# Patient Record
Sex: Male | Born: 1935 | Race: White | Hispanic: No | Marital: Married
Health system: Southern US, Community
[De-identification: ages and names within clinical notes are randomized; demographics above are authoritative.]

---

## 2007-08-05 ENCOUNTER — Encounter: Payer: Self-pay | Admitting: Internal Medicine

## 2007-08-06 ENCOUNTER — Encounter: Payer: Self-pay | Admitting: Internal Medicine

## 2007-09-06 ENCOUNTER — Encounter: Payer: Self-pay | Admitting: Internal Medicine

## 2009-06-29 ENCOUNTER — Emergency Department: Payer: Self-pay | Admitting: Emergency Medicine

## 2009-06-29 ENCOUNTER — Inpatient Hospital Stay: Payer: Self-pay | Admitting: Internal Medicine

## 2011-12-14 ENCOUNTER — Ambulatory Visit: Payer: Self-pay | Admitting: Family Medicine

## 2012-01-24 ENCOUNTER — Ambulatory Visit: Payer: Self-pay | Admitting: Specialist

## 2012-04-11 ENCOUNTER — Ambulatory Visit: Payer: Self-pay | Admitting: Specialist

## 2012-04-15 ENCOUNTER — Ambulatory Visit: Payer: Self-pay | Admitting: Cardiothoracic Surgery

## 2012-04-24 ENCOUNTER — Ambulatory Visit: Payer: Self-pay | Admitting: Cardiothoracic Surgery

## 2012-05-05 ENCOUNTER — Ambulatory Visit: Payer: Self-pay | Admitting: Cardiothoracic Surgery

## 2013-03-07 LAB — COMPREHENSIVE METABOLIC PANEL
Alkaline Phosphatase: 79 U/L (ref 50–136)
BUN: 21 mg/dL — ABNORMAL HIGH (ref 7–18)
Chloride: 101 mmol/L (ref 98–107)
Co2: 31 mmol/L (ref 21–32)
Creatinine: 0.96 mg/dL (ref 0.60–1.30)
Glucose: 142 mg/dL — ABNORMAL HIGH (ref 65–99)
Osmolality: 277 (ref 275–301)
Potassium: 4.2 mmol/L (ref 3.5–5.1)
SGOT(AST): 20 U/L (ref 15–37)
SGPT (ALT): 20 U/L (ref 12–78)
Sodium: 136 mmol/L (ref 136–145)

## 2013-03-07 LAB — CBC
HGB: 10.1 g/dL — ABNORMAL LOW (ref 13.0–18.0)
MCH: 25 pg — ABNORMAL LOW (ref 26.0–34.0)
MCV: 76 fL — ABNORMAL LOW (ref 80–100)
Platelet: 233 10*3/uL (ref 150–440)
RBC: 4.03 10*6/uL — ABNORMAL LOW (ref 4.40–5.90)
RDW: 17.9 % — ABNORMAL HIGH (ref 11.5–14.5)
WBC: 9.1 10*3/uL (ref 3.8–10.6)

## 2013-03-07 LAB — URINALYSIS, COMPLETE
Blood: NEGATIVE
Glucose,UR: NEGATIVE mg/dL (ref 0–75)
Ketone: NEGATIVE
Specific Gravity: 1.01 (ref 1.003–1.030)
Squamous Epithelial: NONE SEEN

## 2013-03-08 ENCOUNTER — Ambulatory Visit: Payer: Self-pay | Admitting: Neurology

## 2013-03-08 ENCOUNTER — Inpatient Hospital Stay: Payer: Self-pay | Admitting: Internal Medicine

## 2013-03-08 LAB — CBC WITH DIFFERENTIAL/PLATELET
Eosinophil #: 0.2 10*3/uL (ref 0.0–0.7)
Eosinophil %: 1.9 %
HGB: 9.4 g/dL — ABNORMAL LOW (ref 13.0–18.0)
Lymphocyte #: 1.6 10*3/uL (ref 1.0–3.6)
Lymphocyte %: 16.2 %
MCHC: 32.7 g/dL (ref 32.0–36.0)
MCV: 76 fL — ABNORMAL LOW (ref 80–100)
Monocyte %: 7.3 %
Neutrophil #: 7.5 10*3/uL — ABNORMAL HIGH (ref 1.4–6.5)
Neutrophil %: 73.6 %
RBC: 3.83 10*6/uL — ABNORMAL LOW (ref 4.40–5.90)
WBC: 10.2 10*3/uL (ref 3.8–10.6)

## 2013-03-08 LAB — COMPREHENSIVE METABOLIC PANEL
Albumin: 2.5 g/dL — ABNORMAL LOW (ref 3.4–5.0)
Alkaline Phosphatase: 73 U/L (ref 50–136)
BUN: 21 mg/dL — ABNORMAL HIGH (ref 7–18)
Bilirubin,Total: 0.3 mg/dL (ref 0.2–1.0)
Calcium, Total: 8.8 mg/dL (ref 8.5–10.1)
Chloride: 103 mmol/L (ref 98–107)
EGFR (African American): >60
EGFR (Non-African Amer.): >60
Glucose: 60 mg/dL — ABNORMAL LOW (ref 65–99)
Potassium: 4.5 mmol/L (ref 3.5–5.1)
SGOT(AST): 19 U/L (ref 15–37)
Sodium: 139 mmol/L (ref 136–145)
Total Protein: 8.1 g/dL (ref 6.4–8.2)

## 2013-03-08 LAB — HEMOGLOBIN A1C: Hemoglobin A1C: 7.1 % — ABNORMAL HIGH (ref 4.2–6.3)

## 2013-03-08 LAB — CK TOTAL AND CKMB (NOT AT ARMC)
CK, Total: 92 U/L (ref 35–232)
CK, Total: 117 U/L (ref 35–232)
CK-MB: 3.6 ng/mL (ref 0.5–3.6)

## 2013-03-08 LAB — TROPONIN I: Troponin-I: <0.02 ng/mL

## 2013-03-09 LAB — URINE CULTURE

## 2013-03-13 LAB — CULTURE, BLOOD (SINGLE)

## 2013-04-05 DEATH — deceased

## 2013-12-05 IMAGING — CT CT HEAD WITHOUT CONTRAST
3 series · 17 of 30 positions shown, 19 images · non-contrast
Comparison: none

REASON FOR EXAM: seizure-like activity, altered mental status
COMMENTS:   May transport without cardiac monitor

PROCEDURE:     CT  - CT HEAD WITHOUT CONTRAST  - March 07, 2013 [DATE]
RESULT:     History: Seizure.
Comparison Study: Head CT of 06/29/2009.

[Series 2: soft tissue · axial · 0.80mm/px · z∈[-184,-54]mm · 8 of 34 slices shown (1 of 2)]
[im 4/34  brain]
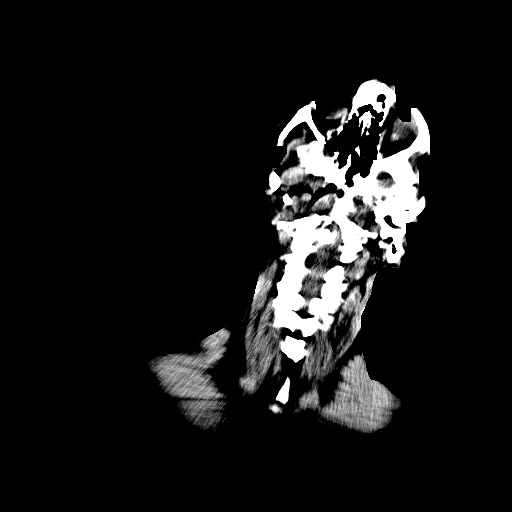
[im 8/34  brain]
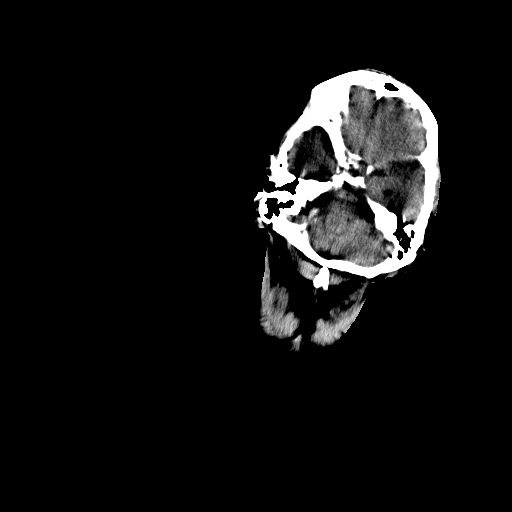
[im 12/34  brain]
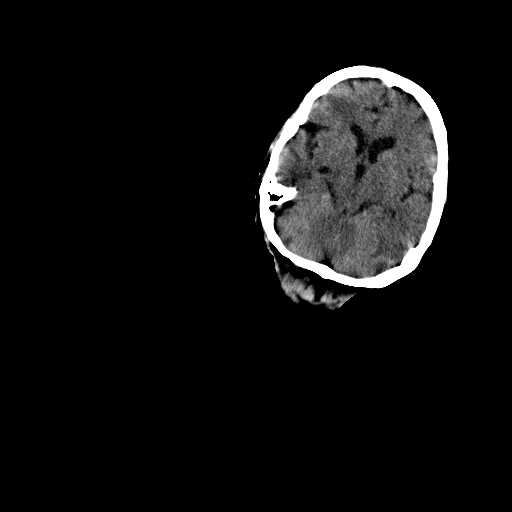
[im 15/34  brain]
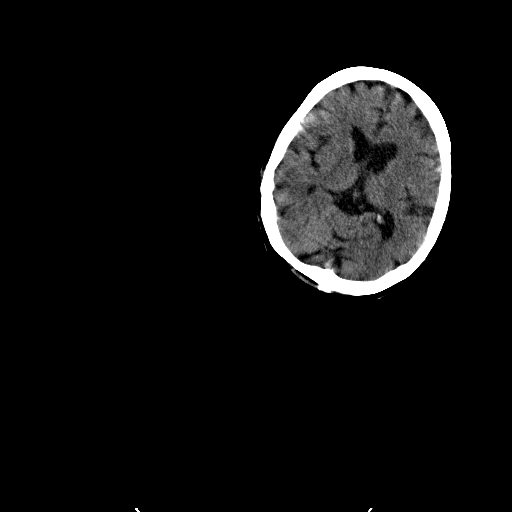
[im 19/34  brain]
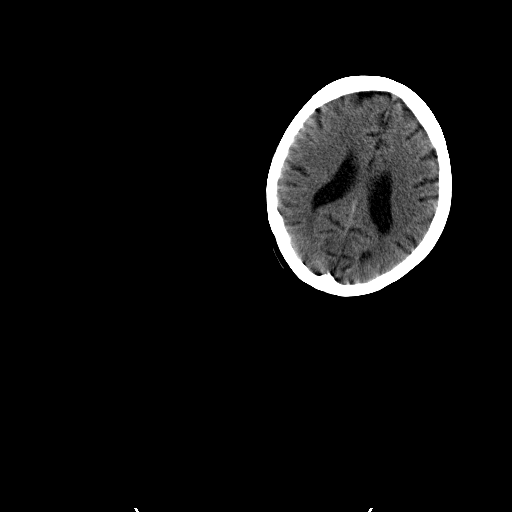
[im 23/34  brain]
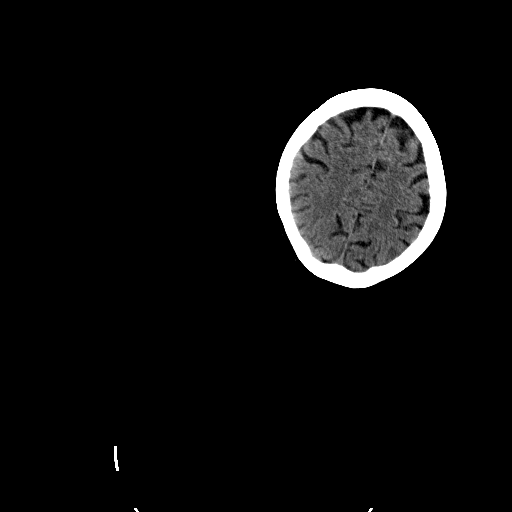
[im 26/34  brain]
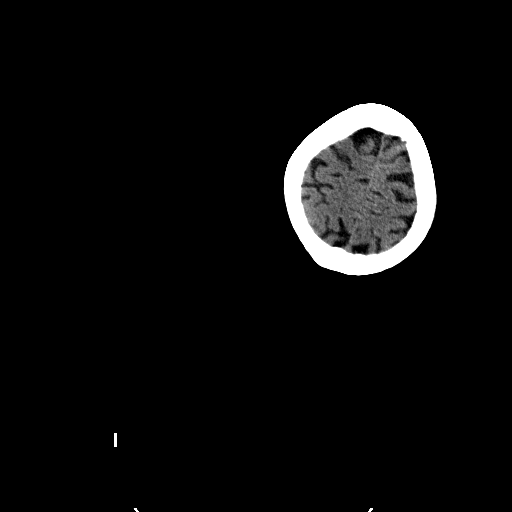
[im 30/34  brain]
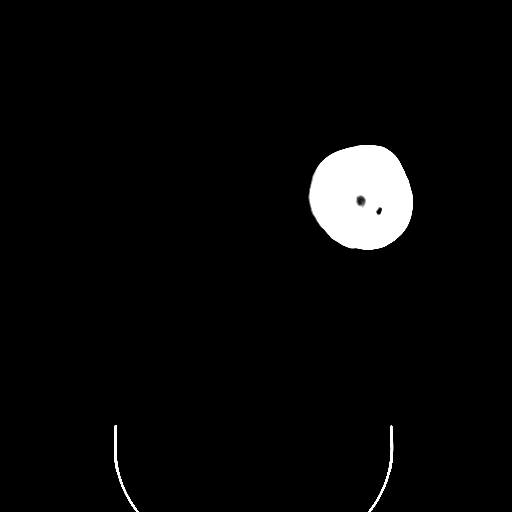

[Series 4: soft tissue · axial · 0.48mm/px · z∈[-180,-50]mm · 8 of 34 slices shown, 10 images (2 of 2)]
[im 4/34  brain]
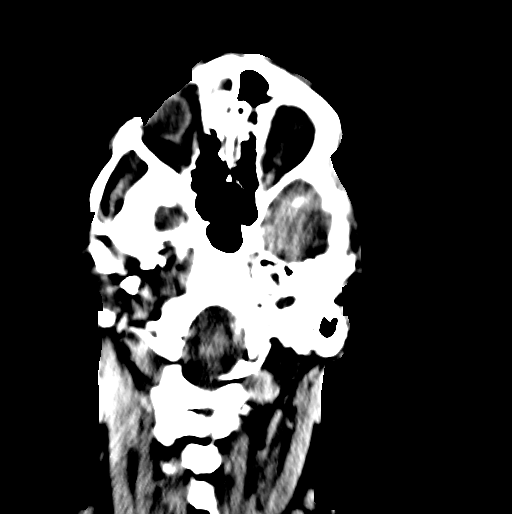
[im 4/34  bone]
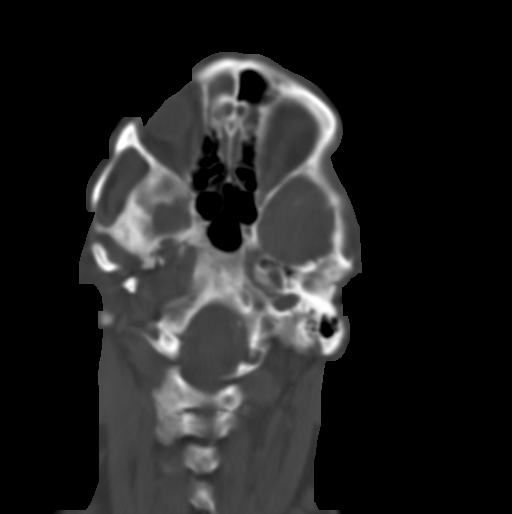
[im 8/34  brain]
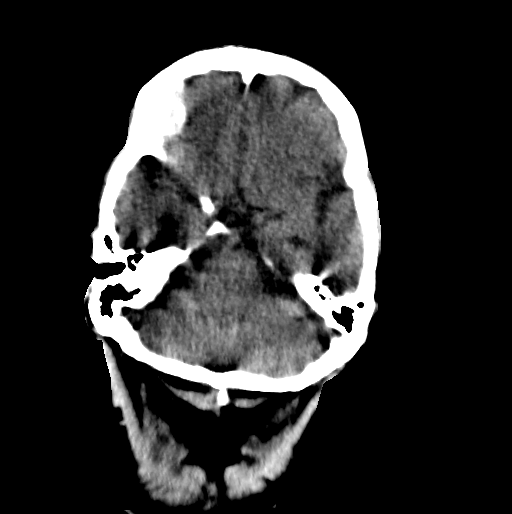
[im 12/34  brain]
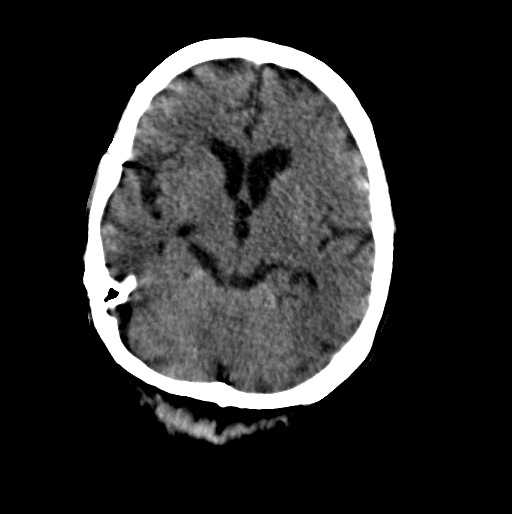
[im 15/34  brain]
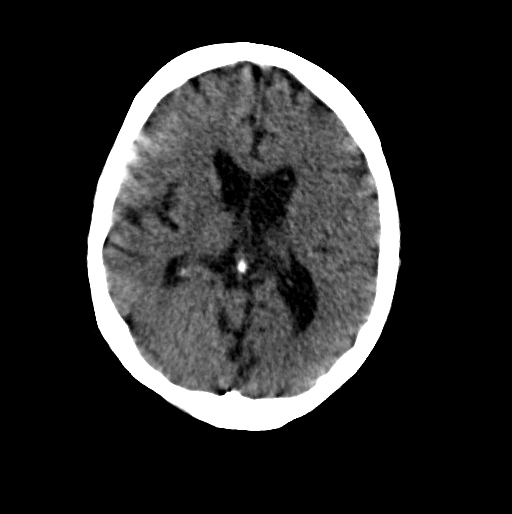
[im 19/34  brain]
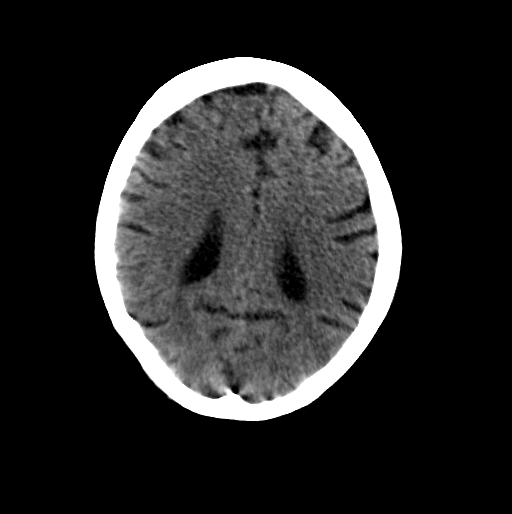
[im 19/34  bone]
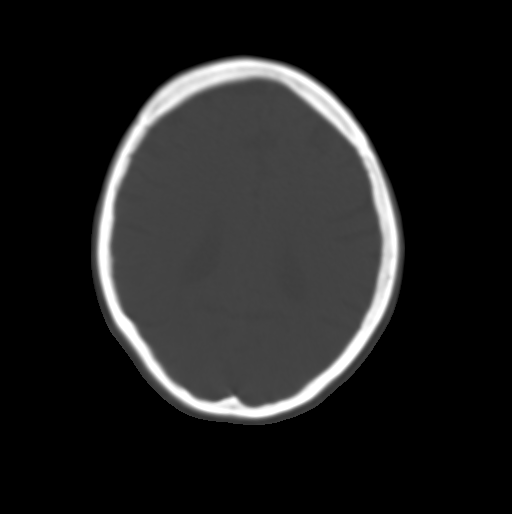
[im 23/34  brain]
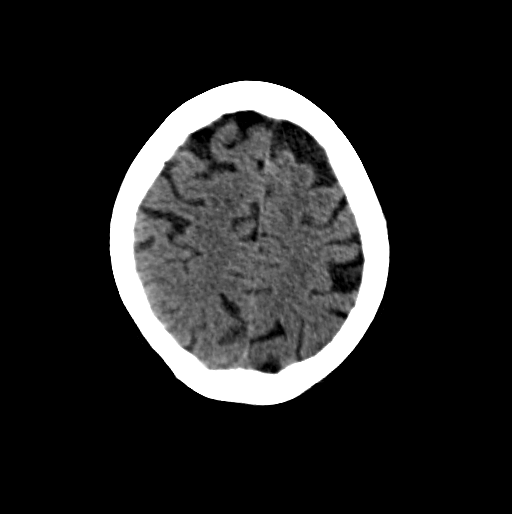
[im 26/34  brain]
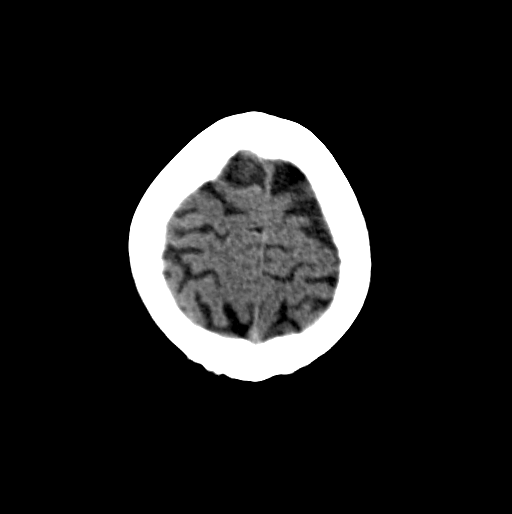
[im 30/34  brain]
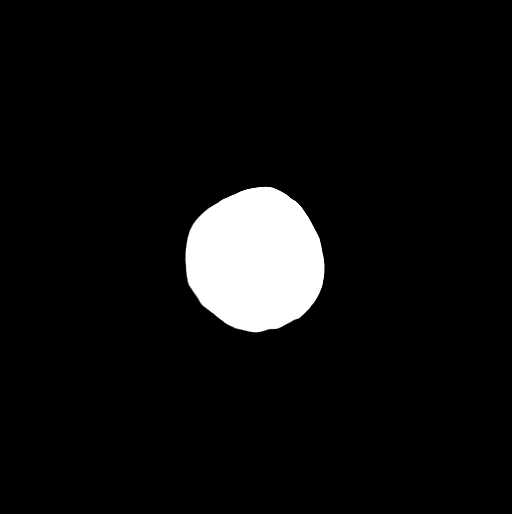

[Series 5: bone · axial · 0.36mm/px · 1 of 33 slices shown]
[im 4/33  bone]
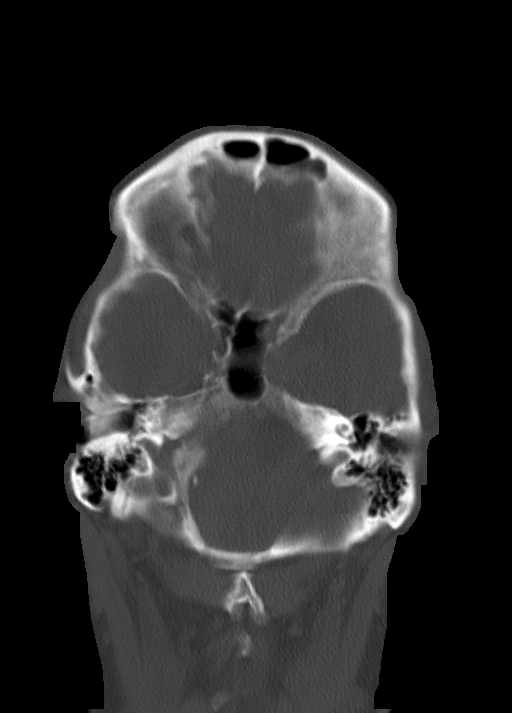

[17 of 30 positions shown; findings below may reference images not displayed]

FINDINGS: No mass. No hydrocephalus. No hemorrhage. No acute bony
abnormality. This is a difficult exam due to patient positioning.
IMPRESSION: No acute abnormality.

## 2015-02-25 NOTE — Discharge Summary (Signed)
PATIENT NAME:  Gerald Miller, Gerald Miller MR#:  [id number] DATE OF Mindi JunkerBIRTH:  04-Sep-1935  DATE OF ADMISSION:  03/07/2013 DATE OF DISCHARGE:  03/09/2013  PRESENTING COMPLAINT: Increased tremors and altered mental status.   DISCHARGE DIAGNOSES: 1.  Altered mental status, improved, resolved.  2.  Possible residual left lower lobe pneumonia.  3.  Advanced Parkinson's disease. The patient is bedbound and wheelchair bound.  4.  Type 2 diabetes.  5.  Hypertension.  6.  Chronic obstructive pulmonary disease.   CONDITION ON DISCHARGE: Fair.   CODE STATUS: FULL CODE.   DISCHARGE MEDICATIONS: 1.  Tylenol 650 mg p.o. q. 4 hours p.r.n.  2.  Aspirin 81 mg daily.  3.  Sliding scale insulin.  4.  Carbidopa/levodopa 10/100 mg 1 tablet q.i.d.  5.  Enalapril 20 mg daily.  6.  Symbicort 80/4.5 two puffs b.i.d.  7.  Zoloft 50 mg at bedtime.  8.  Trazodone 25 mg at bedtime.  9.  Nystatin powder applied to groin t.i.d.  10.  Calcium carbonate 1000 mg q. 4 p.r.n.  11.  Glipizide XL 5 mg daily.  12.  Norco 5/32 mg 1 tablet q. 4 p.r.n.  13.  Aspirin 81 mg daily.  14.  Xanax 0.25 mg at bedtime p.r.n.  15.  Tamsulosin 0.4 mg daily.  16.  Senokot 1 tablet daily.  17.  Lovaza 1 gram b.i.d.  18.  Multivitamin 1 tablet daily.  19.  Zithromax 250 mg p.o. daily for 4 more days.   DISCHARGE DIET:  Pureed, 1800 calorie.   CONSULTANTS:  Neurology with Dr. Alger Miller.   LABORATORY AND DIAGNOSTICS:  Cardiac enzymes x 3 negative. Hemoglobin A1c is 7.1.  White count is 10.2, H and H 9.4 and 28.9, and platelet count 217. Comprehensive metabolic panel within normal limits.   Chest x-ray:  Shows left lower lobe infiltrate with left-sided pleural effusion. Similar findings noted on prior study of 01/24/2012.    BRIEF SUMMARY OF HOSPITAL COURSE:  Mr. Gerald Miller is a 80 year old Caucasian gentleman who comes in from [facility] who has history of advanced Parkinson's disease brought in with:  1.  Altered mental status. The patient's  family noted and staff noted acute changes for the past 24 hours and increased level of confusion. The patient had increased tremors as well. He is currently at baseline. Neurology consultation was obtained with Dr. Alger Miller and there was no indication to increase Parkinson's meds. The patient's mentation is back to normal.  2.  Possible left lower lobe pneumonia.  Recently the patient was treated 2 to 3 weeks ago with Levaquin for possible respiratory infection. The patient clinically does not have recurrent infection. However, his white counts were normal x 2. No productive cough was noted. His chest x-ray appears stable in comparison to 01/24/2012. Blood cultures were negative. However, neurology felt strongly the patient's tremors were increased due to some stress and given his altered mental status the patient was started on IV ertapenem. However, prior to discharge, I changed it to Zithromax for which he will take a 5 day course. The patient remained again afebrile, did not have any respiratory symptoms.  3.  Parkinson's disease, advanced.  Continued home medication. Tremors much improved. The patient is on Sinemet 4 times a day.  4.  Hypertension. ACE inhibitors were continued.  5.  COPD. The patient was on his inhalers and p.r.n. nebs.  His sats were 95% on room air.       The patient is a FULL CODE.  Discharge plan was discussed with the patient's wife.   TIME SPENT: 40 minutes.  ____________________________ Wylie Hail Allena Katz, MD sap:sb D: 03/09/2013 13:30:07 ET T: 03/09/2013 13:53:42 ET JOB#: [id number]  cc: Alaa Mullally A. Allena Katz, MD, <Dictator> Burley Saver, MD Willow Ora MD ELECTRONICALLY SIGNED 03/24/2013 15:21

## 2015-02-25 NOTE — H&P (Signed)
PATIENT NAME:  Gerald Miller, Gerald Miller MR#:  [id number] DATE OF BIRTH:  02-26-1935  DATE OF ADMISSION:  03/07/2013  EMERGENCY DEPARTMENT REFERRING PHYSICIAN: Humberto Leep. Margarita Grizzle, MD  PRIMARY CARE PHYSICIAN: Burley Saver, MD  NURSING HOME: [facility].  ADMITTING PHYSICIAN: Stephanie Acre, MD   CHIEF COMPLAINT: Confusion, altered mental status.   HISTORY OF PRESENT ILLNESS: This is a 80 year old Caucasian male with history of Parkinson disease, hypertension, history of COPD, who presented to the ED with from the nursing home via EMS with complaint of worsening confusion and worsening tremor. Family at the bedside. History primarily per the family and chart review. Family states that they were called by the nursing home who said that the patient was acting more confused over the last 24 hours and had been having a little bit more tremor. They were concerned about him having possible seizure activity or worsening of  his Parkinson disease. In the ED, he was seen by neurology who deemed that this is not new seizure activity. However, if there is concern for seizure activity, then he should have an EEG done and an MRI. Per neurology notes, they stated that he probably most likely has just metabolic encephalopathy and medical reasons should be followed up, and this could be causing worsening of his tremor from his Parkinson disease. Family states that he has not had any recent sick contacts. He did have a pneumonia about 2 weeks ago, for which she was treated with Levaquin. His main issue today is worsening mental status. Family states he is normally alert, oriented, and lives at [facility] nursing home. Hospitalist services were consulted for further inpatient workup and management.   PAST MEDICAL HISTORY: Parkinson disease, diabetes, lower extremity edema, hypertension, COPD.  MEDICATIONS: Per the MAR from the pharmacy: Acetaminophen/hydrocodone 325/5, 1 or 2 tabs orally every 4 hours as needed for pain;  albuterol 2.5/3 mL 1 vial via nebulizer 4 times a day as needed for wheezing; alprazolam 0.25, 1 tablet a day as needed for sleep; aspirin 81 mg 1 tab a day; carbidopa/levodopa 10/100, 1 tab 5 times a day for Parkinson's; enalapril 20 mg 1 tab once a day for hypertension; furosemide 40 mg 1 tab b.i.d.; glipizide XL 5 mg 1 tab q.a.m.; multivitamin 1 tab daily; omega-3 supplement 1 capsule b.i.d.; sertraline 60 mg 1 tab daily at bedtime; Symbicort 80/4.5, 1 puff b.i.d.; tamsulosin 0.4 mg 1 capsule daily for BPH; trazodone 50 mg 1/2 tab once a day; TUMS 1 tab every 4 hours as needed for indigestion; Tylenol 325, 2 tabs every 4 hours as needed for pain.   PAST SURGICAL HISTORY: Hernia repair and appendectomy.   SOCIAL HISTORY: The patient lives at [facility] nursing home. He does have a living will, however, family cannot identify at this time. No IV drug use. He was a former smoker, quit 30 years ago. No drinking currently. He drank beer per history regularly prior to being placed in the nursing home.   CODE STATUS: Full code.   FAMILY HISTORY: Mother died of leukemia at age of 20. The patient did not know his father.   ALLERGIES: PENICILLIN.   REVIEW OF SYSTEMS: Difficult to obtain, as the patient is alert but he is not oriented. CONSTITUTIONAL: No fever, fatigue. No weakness. No pain, weight loss or weight gain.  EYES: No blurred vision, double vision.  EARS, NOSE, THROAT: No tinnitus, ear pain.  RESPIRATORY: Positive cough, mild shortness of breath.  CARDIOVASCULAR: No chest pain. Mild edema in the lower  extremities. Positive high blood pressure.  GASTROINTESTINAL: No nausea, vomiting, diarrhea. GENITOURINARY: No dysuria or hematuria.  ENDOCRINE: No polyuria, nocturia or thyroid problems.  HEMATOLOGIC AND LYMPHATIC: No anemia, easy bruising, bleeding or swollen glands.  MUSCULOSKELETAL: No arthritis. Positive lower extremity swelling.  NEUROLOGIC: Positive tremors.  PSYCHIATRIC: Positive  nervousness.   PHYSICAL EXAMINATION:  VITAL SIGNS: In the ED are as follows: Temperature 97.9, blood pressure 136/75, heart rate 92, respirations 22, pulse oximetry 95% on room air.  GENERAL APPEARANCE: Well-developed, well-nourished male lying in bed in no acute respiratory distress.  HEENT: PERRLA. EOMI. No scleral icterus. No difficulty hearing. TMs are intact. No pharyngeal erythema. Mucous membranes are mildly dry. He is noted to have a mild left-sided facial droop.  NECK: No thyroid enlargement or nodules. Neck is supple and nontender.  RESPIRATORY: Coarse upper airway sounds. Mild wheezing is noted bilaterally in the upper fields. Otherwise, diminished breath sounds at the bilateral lower bases. No labored breathing is noted.  CARDIOVASCULAR: Regular rate, regular rhythm. No murmurs. S1, S2 auscultated. Trace lower extremity is noted at this time.  ABDOMEN: Soft, nontender, nondistended. Positive bowel sounds are noted.   GENITOURINARY: Normal external male genitalia.  MUSCULOSKELETAL: Difficult to assess. The patient has constant tremors in his upper extremities bilaterally. Pill rolling motion of his fingers is noted also. Gait not assessed. SKIN: No unusual rash or lesions per examination. Skin is warm and dry.  LYMPH: No adenopathy noted in cervical, axilla or supraclavicular regions.  NEUROLOGIC: Cranial nerves II through XII intact. Deep tendon reflexes are intact. Resting tremor is noted with pill rolling of the fingers upon initiation of movement tremor stopped.  PSYCHIATRIC: The patient is alert. He is disoriented to person, time and place. He is cooperative. Judgment is poor.   LABORATORY, DIAGNOSTIC AND RADIOLOGICAL DATA: Sodium 136, potassium 4.2, chloride 101, bicarbonate 31, calcium 8.7, BUN 21, creatinine 0.96, glucose at 142. His troponin is less than 0.02. CBC: White cell count 9.1, hemoglobin 10.1, hematocrit 30.5, platelet count 233. He did have a CT of the head that showed  no acute abnormalities. He did have a chest x-ray that showed left lower lobe infiltrate with left-sided effusion, and component of scarring is noted. His EKG shows no acute ST-T wave changes. Rate difficult to determine due to moderate amount of motion artifact. I would say this is probably normal sinus rhythm.  ASSESSMENT AND PLAN: This is a 80 year old male with past medical history of Parkinson disease, diabetes, chronic obstructive pulmonary disease, hypertension, admitted for altered mental status and possible persistent left lower lobe pneumonia. 1.  Altered mental status. Acute changes for the past 24 hours in terms of increased level of confusion. He does not have confusion at baseline. The patient was seen by neurology. No strong recommendation for lumbar puncture at this time. Recommend to continue with medical workup of what is deemed to be his metabolic encephalopathy. At this time, we will treat his left lower lobe pneumonia. May consider an MRI if his mentation continues to get worse. Check blood and urine cultures. Limit his benzodiazepine and narcotic use.  2.  Left lower lobe pneumonia, recently treated 2 weeks ago with Levaquin, however, still with persistent infiltrate and mild effusion in the left lower lobe. At this time, we will treat as a healthcare-associated pneumonia. HE DOES HAVE A PENICILLIN ALLERGY NOTED. Will treat with a carbapenem, which was discussed with the pharmacy. Will check blood and urine cultures again.  3.  Parkinson disease: Continue  with home medications.  4.  Hypertension: Continue with home medications. 5.  Chronic obstructive pulmonary disease: Will continue with Symbicort and inhalers.  6.  Gastrointestinal and deep vein thrombosis prophylaxis: The patient is on a proton pump inhibitor and Lovenox.  7.  Currently, the patient is a full code until family finds his living will.  8.  Primary care physician is Dr. Quillian Quince.  TIME SPENT: Dictating and evaluating  the patient is 55 minutes.   ____________________________ Stephanie Acre, MD vm:jm D: 03/07/2013 20:27:11 ET T: 03/07/2013 20:54:09 ET JOB#: [id number]  cc: Stephanie Acre, MD, <Dictator> Stephanie Acre MD ELECTRONICALLY SIGNED 03/09/2013 11:19

## 2015-02-25 NOTE — H&P (Signed)
Past Med/Surgical Hx:  dysphagia:   insomnia:   ashtma:   peptic gastric uclers:   psoriasis:   CHF:   onychia of toe:   dermatophytosis of nail:   Anxiety:   GERD - Esophageal Reflux:   Hypercholesterolemia:   HTN:   Diabetes:   Parkinson's Disease:   THORACENTESIS:   inguinal hernia repair:   Hernia Repair:   Appendectomy:   ALLERGIES:  PCN: Resp. Distress  Shellfish: Rash  SHRIMP: Rash   Assessment/Admission Diagnosis 80 yo with PMHx of Parkinsons disease, DM, COPD, HTN admitted for AMS and possible persistent LLL PNA  1. AMS - acute change over the past 24 hours, worsening confusion - seen by neurology, no strong recommendation for LP at this time, recommed to continue with medical workup - check blood, urine culture - limit benzo\narcotic use  2. LLL PNA - recently treated 2 wks ago with levaquin however still with persistent infiltrate and mild effusion on the LLL - will treat as HCAP, patient with PCN allergy, will try carbapenem (Ertapenem), discussed with pharmacy - check blood and urine cultures  3. Parkinson disease - cont with home meds  4. HTN - cont with home meds  5. COPD - cont with Symbicort and inhalers  GI/DVT prophylaxis - PPI/Lovenox  FULL CODE PMD - Dr.Bliss   Time spent evaluating patient = 55 minutes EAV#[id number]#[id number]   Electronic Signatures: Stephanie AcreMungal, Clare Fennimore (MD)  (Signed [id number]-May-14 20:28)  Authored: PAST MEDICAL/SURGIAL HISTORY, ALLERGIES, HOME MEDICATIONS, ASSESSMENT AND PLAN   Last Updated: 03-May-14 20:28 by Stephanie AcreMungal, Corinne Goucher (MD)

## 2015-02-25 NOTE — Consult Note (Signed)
PATIENT NAME:  Gerald Miller, Gerald Miller MR#:  [id number] DATE OF BIRTH:  [birth year]  NEUROLOGY CONSULTATION  DATE OF ADMISSION:  03/07/2013   DATE OF CONSULTATION:  03/08/2013  REQUESTING PHYSICIAN:  Stephanie Acre, MD  CONSULTING PHYSICIAN:  Chanya Chrisley B. Coda Mathey, MD  REASON FOR CONSULTATION:   Parkinson's disease.   HISTORY OF PRESENT ILLNESS: This is a 80 year old Caucasian male with a past medical history of Parkinson's disease, hypertension, COPD, who presented to the Emergency Department from the nursing home with complaints of worsening confusion and worsening tremor. History was obtained by his wife and medical record, primarily. She says that in the past few days he has been having more tremor. He had several episodes of generalized stiffening that caused him to almost slip out of his wheelchair. He has had altered mental status over the past few days and was not making any sense.   He did have pneumonia about 2 weeks ago and was treated with Levaquin. In the Emergency Department his chest x-ray showed a persistent left lower lobe opacity with associated pleural effusion, consistent with persistent pneumonia. Per his wife he does not have a history of seizures. From his Parkinson's disease he does have significant rigidity tremor at baseline. He is nonambulatory, and is wheelchair-bound. He takes Sinemet 10/100 mg 5 times a day. He did try other Parkinson's disease medications in the past, but they did result in some hallucinations. His wife is not clear what other Parkinson's medications he has tried. He used to be seen [facility] neurology at [facility], but has not been seen there in many years. She said that in the past they talked about a deep brain stimulator placement, but he was not interested in that. He currently has his Sinemet prescribed by the nursing home and is not under neurological care.   REVIEW OF SYSTEMS: A complete 14-point review of systems is negative other than what is noted in the  HPI.   PAST MEDICAL HISTORY: Parkinson's disease, diabetes, lower extremity edema, hypertension, COPD.   HOME MEDICATIONS:  1.  Acetaminophen/hydrocodone.  2. Albuterol.  3. Alprazolam.  4. Sinemet 10/100, 5 times a day.  5. Enalapril.  6.  Furosemide.  7. Glipizide.  8.  Multivitamin.  9.  Omega-3 supplement.  10.  Sertraline.  11.  Symbicort.  12.  Tamsulosin. 13.  Trazodone.  14. TUMS.  15.  Tylenol.  PAST SURGICAL HISTORY: Hernia repair, and appendectomy.   SOCIAL HISTORY: He lives at [facility] nursing home. He does not have a history of drug use. Previous tobacco and alcohol abuse.   CODE STATUS: FULL.   FAMILY HISTORY: Noncontributory.   ALLERGIES: PENICILLIN.   PHYSICAL EXAMINATION: VITAL SIGNS: Temperature 98.5, pulse 75, respirations 21, blood pressure 146/75, pulse ox 97% on room air.  GENERAL: Supine, elderly, no apparent distress.  CARDIOVASCULAR: Regular rate and rhythm.  RESPIRATORY: Clear anteriorly.  ABDOMEN: Soft.  MENTAL STATUS: He is alert and oriented to person, place, and year, but not to month. Naming and repetition are intact. Speech is fluent. There is mild dysarthria.  CRANIAL NERVES: Pupils equal, round and reactive to light. Full visual fields. Extraocular movements intact. No facial sensory deficits. Facial expression symmetric. Shoulder-shrug  5/5 bilaterally. Tongue midline. He does masked facies.  MOTOR: He has a resting tremor in all 4 extremities. There is diffuse rigidity. His strength is  5/5 throughout.  COORDINATION: Tremor reduces with action. There is no ataxia or dysmetria.  DEEP TENDON REFLEXES: 2+ out of 4 throughout,  with toes downgoing.  SENSATION: No deficit to light touch or temperature throughout.  GAIT: He is nonambulatory.   IMAGING: CT head shows no acute intracranial abnormality.   Chest x-ray showed left lower lobe infiltrate, with left-sided effusion.   LABS: CBC is significant for a decreased hematocrit of 30.5;  otherwise unremarkable.   BMP is significant for an elevated of BUN of 21; otherwise unremarkable.   LFTs are within normal limits.   Cardiac enzymes are negative.   Urinalysis is negative.   ASSESSMENT AND PLAN:  1.  End-stage Parkinson's disease.  2.  Worsening Parkinson's disease, secondary to pneumonia.  2.  Pneumonia.  2.  Encephalopathy secondary to pneumonia  RECOMMENDATIONS: 1.  Continue home Parkinson's disease regimen.  2.  Sinemet 10/100, 5 times daily.  3. I feel that his Parkinson's disease can be improved, however I do not recommend making medication adjustments in the setting of acute illness as the side-effects of the Parkinson's disease medications may be increased in this scenario. Would await to make these adjustments as an outpatient. [facility] so I suggested to the wife that he be seen there again. 4.  Doubt seizure, but would get an EEG tomorrow to rule out.  5.  Continue treatment of his pneumonia.  6.  Call neurology back with any additional questions.    ____________________________ Sid Falconhrista B. Keefer Soulliere, MD cbs:dm D: 03/08/2013 13:07:31 ET T: 03/08/2013 14:28:12 ET JOB#: [id number]  cc: Porshea Janowski B. Alger SimonsSwisher, MD, <Dictator> Trellis PaganiniHRISTA B Sherisse Fullilove MD ELECTRONICALLY SIGNED 04/12/2013 13:12
